# Patient Record
Sex: Female | Born: 1955 | Hispanic: No | Marital: Married | Smoking: Never smoker
Health system: Southern US, Community
[De-identification: ages and names within clinical notes are randomized; demographics above are authoritative.]

---

## 2011-04-21 ENCOUNTER — Other Ambulatory Visit: Payer: Self-pay | Admitting: Family Medicine

## 2011-04-21 DIAGNOSIS — R921 Mammographic calcification found on diagnostic imaging of breast: Secondary | ICD-10-CM

## 2011-04-28 ENCOUNTER — Ambulatory Visit
Admission: RE | Admit: 2011-04-28 | Discharge: 2011-04-28 | Disposition: A | Discharge status: Home / Self Care | Payer: BC Managed Care – PPO | Source: Ambulatory Visit | Attending: Family Medicine | Admitting: Family Medicine

## 2011-04-28 DIAGNOSIS — R921 Mammographic calcification found on diagnostic imaging of breast: Secondary | ICD-10-CM

## 2011-09-21 ENCOUNTER — Other Ambulatory Visit: Payer: Self-pay | Admitting: Family Medicine

## 2011-09-21 DIAGNOSIS — R921 Mammographic calcification found on diagnostic imaging of breast: Secondary | ICD-10-CM

## 2011-10-19 ENCOUNTER — Ambulatory Visit
Admission: RE | Admit: 2011-10-19 | Discharge: 2011-10-19 | Disposition: A | Discharge status: Home / Self Care | Payer: BC Managed Care – PPO | Source: Ambulatory Visit | Attending: Family Medicine | Admitting: Family Medicine

## 2011-10-19 DIAGNOSIS — R921 Mammographic calcification found on diagnostic imaging of breast: Secondary | ICD-10-CM

## 2012-03-18 ENCOUNTER — Other Ambulatory Visit: Payer: Self-pay | Admitting: Family Medicine

## 2012-03-18 DIAGNOSIS — R921 Mammographic calcification found on diagnostic imaging of breast: Secondary | ICD-10-CM

## 2012-04-28 ENCOUNTER — Ambulatory Visit
Admission: RE | Admit: 2012-04-28 | Discharge: 2012-04-28 | Disposition: A | Discharge status: Home / Self Care | Payer: BC Managed Care – PPO | Source: Ambulatory Visit | Attending: Family Medicine | Admitting: Family Medicine

## 2012-04-28 DIAGNOSIS — R921 Mammographic calcification found on diagnostic imaging of breast: Secondary | ICD-10-CM

## 2013-03-28 ENCOUNTER — Other Ambulatory Visit: Payer: Self-pay | Admitting: Family Medicine

## 2013-03-28 DIAGNOSIS — R921 Mammographic calcification found on diagnostic imaging of breast: Secondary | ICD-10-CM

## 2013-05-01 ENCOUNTER — Ambulatory Visit
Admission: RE | Admit: 2013-05-01 | Discharge: 2013-05-01 | Disposition: A | Discharge status: Home / Self Care | Payer: BC Managed Care – PPO | Source: Ambulatory Visit | Attending: Family Medicine | Admitting: Family Medicine

## 2013-05-01 DIAGNOSIS — R921 Mammographic calcification found on diagnostic imaging of breast: Secondary | ICD-10-CM

## 2014-04-23 ENCOUNTER — Other Ambulatory Visit: Payer: Self-pay

## 2014-04-23 DIAGNOSIS — Z1231 Encounter for screening mammogram for malignant neoplasm of breast: Secondary | ICD-10-CM

## 2014-05-08 ENCOUNTER — Ambulatory Visit
Admission: RE | Admit: 2014-05-08 | Discharge: 2014-05-08 | Disposition: A | Discharge status: Home / Self Care | Payer: BC Managed Care – PPO | Source: Ambulatory Visit

## 2014-05-08 DIAGNOSIS — Z1231 Encounter for screening mammogram for malignant neoplasm of breast: Secondary | ICD-10-CM

## 2015-05-30 ENCOUNTER — Other Ambulatory Visit: Payer: Self-pay

## 2015-05-30 DIAGNOSIS — Z1231 Encounter for screening mammogram for malignant neoplasm of breast: Secondary | ICD-10-CM

## 2015-06-11 ENCOUNTER — Ambulatory Visit: Payer: Self-pay

## 2015-06-20 ENCOUNTER — Ambulatory Visit
Admission: RE | Admit: 2015-06-20 | Discharge: 2015-06-20 | Disposition: A | Discharge status: Home / Self Care | Payer: BLUE CROSS/BLUE SHIELD | Source: Ambulatory Visit

## 2015-06-20 DIAGNOSIS — Z1231 Encounter for screening mammogram for malignant neoplasm of breast: Secondary | ICD-10-CM

## 2016-05-27 ENCOUNTER — Other Ambulatory Visit: Payer: Self-pay | Admitting: Internal Medicine

## 2016-05-27 DIAGNOSIS — Z1231 Encounter for screening mammogram for malignant neoplasm of breast: Secondary | ICD-10-CM

## 2016-06-25 ENCOUNTER — Ambulatory Visit
Admission: RE | Admit: 2016-06-25 | Discharge: 2016-06-25 | Disposition: A | Discharge status: Home / Self Care | Payer: BLUE CROSS/BLUE SHIELD | Source: Ambulatory Visit | Attending: Internal Medicine | Admitting: Internal Medicine

## 2016-06-25 DIAGNOSIS — Z1231 Encounter for screening mammogram for malignant neoplasm of breast: Secondary | ICD-10-CM

## 2017-05-27 ENCOUNTER — Other Ambulatory Visit: Payer: Self-pay | Admitting: Internal Medicine

## 2017-05-27 DIAGNOSIS — Z1231 Encounter for screening mammogram for malignant neoplasm of breast: Secondary | ICD-10-CM

## 2017-06-28 ENCOUNTER — Ambulatory Visit: Payer: BLUE CROSS/BLUE SHIELD

## 2017-07-13 ENCOUNTER — Ambulatory Visit
Admission: RE | Admit: 2017-07-13 | Discharge: 2017-07-13 | Disposition: A | Discharge status: Home / Self Care | Payer: BLUE CROSS/BLUE SHIELD | Source: Ambulatory Visit | Attending: Internal Medicine | Admitting: Internal Medicine

## 2017-07-13 DIAGNOSIS — Z1231 Encounter for screening mammogram for malignant neoplasm of breast: Secondary | ICD-10-CM

## 2018-07-21 ENCOUNTER — Other Ambulatory Visit: Payer: Self-pay | Admitting: Internal Medicine

## 2018-07-21 DIAGNOSIS — Z1231 Encounter for screening mammogram for malignant neoplasm of breast: Secondary | ICD-10-CM

## 2018-08-22 ENCOUNTER — Ambulatory Visit: Payer: BLUE CROSS/BLUE SHIELD

## 2018-10-17 ENCOUNTER — Ambulatory Visit: Payer: BLUE CROSS/BLUE SHIELD

## 2019-05-02 ENCOUNTER — Other Ambulatory Visit: Payer: Self-pay | Admitting: Obstetrics and Gynecology

## 2019-05-02 DIAGNOSIS — R928 Other abnormal and inconclusive findings on diagnostic imaging of breast: Secondary | ICD-10-CM

## 2019-05-15 ENCOUNTER — Ambulatory Visit: Payer: Medicare Other | Attending: Internal Medicine

## 2019-05-15 DIAGNOSIS — R238 Other skin changes: Secondary | ICD-10-CM

## 2019-05-15 DIAGNOSIS — U071 COVID-19: Secondary | ICD-10-CM

## 2019-05-17 ENCOUNTER — Ambulatory Visit
Admission: RE | Admit: 2019-05-17 | Discharge: 2019-05-17 | Disposition: A | Discharge status: Home / Self Care | Payer: BLUE CROSS/BLUE SHIELD | Source: Ambulatory Visit | Attending: Obstetrics and Gynecology | Admitting: Obstetrics and Gynecology

## 2019-05-17 ENCOUNTER — Other Ambulatory Visit: Payer: Self-pay

## 2019-05-17 ENCOUNTER — Ambulatory Visit: Payer: BLUE CROSS/BLUE SHIELD

## 2019-05-17 DIAGNOSIS — R928 Other abnormal and inconclusive findings on diagnostic imaging of breast: Secondary | ICD-10-CM

## 2019-05-17 LAB — NOVEL CORONAVIRUS, NAA: SARS-CoV-2, NAA: NOT DETECTED

## 2019-05-18 ENCOUNTER — Telehealth: Payer: Self-pay | Admitting: General Practice

## 2019-05-18 NOTE — Telephone Encounter (Signed)
Negative COVID results given. Patient results "NOT Detected." Caller expressed understanding. ° °

## 2019-05-26 ENCOUNTER — Ambulatory Visit: Payer: Medicare Other | Attending: Internal Medicine

## 2019-05-26 ENCOUNTER — Ambulatory Visit: Payer: Self-pay | Attending: Internal Medicine

## 2019-05-26 DIAGNOSIS — Z20822 Contact with and (suspected) exposure to covid-19: Secondary | ICD-10-CM

## 2019-05-28 LAB — NOVEL CORONAVIRUS, NAA: SARS-CoV-2, NAA: NOT DETECTED

## 2020-05-28 ENCOUNTER — Other Ambulatory Visit: Payer: Self-pay | Admitting: Obstetrics and Gynecology

## 2020-05-28 DIAGNOSIS — Z1231 Encounter for screening mammogram for malignant neoplasm of breast: Secondary | ICD-10-CM

## 2020-07-09 ENCOUNTER — Other Ambulatory Visit: Payer: Self-pay

## 2020-07-09 ENCOUNTER — Ambulatory Visit
Admission: RE | Admit: 2020-07-09 | Discharge: 2020-07-09 | Disposition: A | Discharge status: Home / Self Care | Payer: Medicare Other | Source: Ambulatory Visit | Attending: Obstetrics and Gynecology | Admitting: Obstetrics and Gynecology

## 2020-07-09 DIAGNOSIS — Z1231 Encounter for screening mammogram for malignant neoplasm of breast: Secondary | ICD-10-CM

## 2020-08-07 DIAGNOSIS — H5203 Hypermetropia, bilateral: Secondary | ICD-10-CM | POA: Diagnosis not present

## 2021-06-11 DIAGNOSIS — Z13220 Encounter for screening for lipoid disorders: Secondary | ICD-10-CM | POA: Diagnosis not present

## 2021-06-11 DIAGNOSIS — E039 Hypothyroidism, unspecified: Secondary | ICD-10-CM | POA: Diagnosis not present

## 2021-06-11 DIAGNOSIS — Z13820 Encounter for screening for osteoporosis: Secondary | ICD-10-CM | POA: Diagnosis not present

## 2021-06-11 DIAGNOSIS — Z1211 Encounter for screening for malignant neoplasm of colon: Secondary | ICD-10-CM | POA: Diagnosis not present

## 2021-06-11 DIAGNOSIS — Z136 Encounter for screening for cardiovascular disorders: Secondary | ICD-10-CM | POA: Diagnosis not present

## 2021-06-11 DIAGNOSIS — Z1231 Encounter for screening mammogram for malignant neoplasm of breast: Secondary | ICD-10-CM | POA: Diagnosis not present

## 2021-06-11 DIAGNOSIS — I10 Essential (primary) hypertension: Secondary | ICD-10-CM | POA: Diagnosis not present

## 2021-06-11 DIAGNOSIS — Z0000 Encounter for general adult medical examination without abnormal findings: Secondary | ICD-10-CM | POA: Diagnosis not present

## 2021-06-11 DIAGNOSIS — Z1159 Encounter for screening for other viral diseases: Secondary | ICD-10-CM | POA: Diagnosis not present

## 2021-07-11 ENCOUNTER — Other Ambulatory Visit: Payer: Self-pay | Admitting: Family Medicine

## 2021-07-11 DIAGNOSIS — Z1231 Encounter for screening mammogram for malignant neoplasm of breast: Secondary | ICD-10-CM

## 2021-09-01 DIAGNOSIS — I10 Essential (primary) hypertension: Secondary | ICD-10-CM | POA: Diagnosis not present

## 2021-09-01 DIAGNOSIS — E039 Hypothyroidism, unspecified: Secondary | ICD-10-CM | POA: Diagnosis not present

## 2021-09-01 DIAGNOSIS — M175 Other unilateral secondary osteoarthritis of knee: Secondary | ICD-10-CM | POA: Diagnosis not present

## 2021-09-16 DIAGNOSIS — R1013 Epigastric pain: Secondary | ICD-10-CM | POA: Diagnosis not present

## 2021-09-16 DIAGNOSIS — K219 Gastro-esophageal reflux disease without esophagitis: Secondary | ICD-10-CM | POA: Diagnosis not present

## 2021-09-22 ENCOUNTER — Ambulatory Visit
Admission: RE | Admit: 2021-09-22 | Discharge: 2021-09-22 | Disposition: A | Discharge status: Home / Self Care | Payer: Medicare Other | Source: Ambulatory Visit | Attending: Family Medicine | Admitting: Family Medicine

## 2021-09-22 DIAGNOSIS — Z1231 Encounter for screening mammogram for malignant neoplasm of breast: Secondary | ICD-10-CM | POA: Diagnosis not present

## 2021-10-06 DIAGNOSIS — R1013 Epigastric pain: Secondary | ICD-10-CM | POA: Diagnosis not present

## 2021-10-06 DIAGNOSIS — Z1211 Encounter for screening for malignant neoplasm of colon: Secondary | ICD-10-CM | POA: Diagnosis not present

## 2021-10-06 DIAGNOSIS — D120 Benign neoplasm of cecum: Secondary | ICD-10-CM | POA: Diagnosis not present

## 2021-10-06 DIAGNOSIS — K5730 Diverticulosis of large intestine without perforation or abscess without bleeding: Secondary | ICD-10-CM | POA: Diagnosis not present

## 2021-10-06 DIAGNOSIS — D125 Benign neoplasm of sigmoid colon: Secondary | ICD-10-CM | POA: Diagnosis not present

## 2021-10-06 DIAGNOSIS — K3189 Other diseases of stomach and duodenum: Secondary | ICD-10-CM | POA: Diagnosis not present

## 2021-10-06 DIAGNOSIS — K2930 Chronic superficial gastritis without bleeding: Secondary | ICD-10-CM | POA: Diagnosis not present

## 2021-10-06 DIAGNOSIS — B9681 Helicobacter pylori [H. pylori] as the cause of diseases classified elsewhere: Secondary | ICD-10-CM | POA: Diagnosis not present

## 2021-10-06 DIAGNOSIS — D128 Benign neoplasm of rectum: Secondary | ICD-10-CM | POA: Diagnosis not present

## 2021-10-10 DIAGNOSIS — D120 Benign neoplasm of cecum: Secondary | ICD-10-CM | POA: Diagnosis not present

## 2021-10-10 DIAGNOSIS — K2930 Chronic superficial gastritis without bleeding: Secondary | ICD-10-CM | POA: Diagnosis not present

## 2021-10-16 ENCOUNTER — Other Ambulatory Visit: Payer: Self-pay | Admitting: Family Medicine

## 2021-10-16 DIAGNOSIS — Z13820 Encounter for screening for osteoporosis: Secondary | ICD-10-CM

## 2022-03-11 DIAGNOSIS — E785 Hyperlipidemia, unspecified: Secondary | ICD-10-CM | POA: Diagnosis not present

## 2022-03-11 DIAGNOSIS — Z23 Encounter for immunization: Secondary | ICD-10-CM | POA: Diagnosis not present

## 2022-03-11 DIAGNOSIS — Z8249 Family history of ischemic heart disease and other diseases of the circulatory system: Secondary | ICD-10-CM | POA: Diagnosis not present

## 2022-03-11 DIAGNOSIS — M1711 Unilateral primary osteoarthritis, right knee: Secondary | ICD-10-CM | POA: Diagnosis not present

## 2022-03-16 ENCOUNTER — Other Ambulatory Visit (HOSPITAL_BASED_OUTPATIENT_CLINIC_OR_DEPARTMENT_OTHER): Payer: Self-pay | Admitting: Family Medicine

## 2022-03-16 DIAGNOSIS — Z8249 Family history of ischemic heart disease and other diseases of the circulatory system: Secondary | ICD-10-CM

## 2022-03-23 DIAGNOSIS — M25561 Pain in right knee: Secondary | ICD-10-CM | POA: Diagnosis not present

## 2022-06-01 ENCOUNTER — Ambulatory Visit (HOSPITAL_BASED_OUTPATIENT_CLINIC_OR_DEPARTMENT_OTHER)
Admission: RE | Admit: 2022-06-01 | Discharge: 2022-06-01 | Disposition: A | Discharge status: Home / Self Care | Payer: Medicare Other | Source: Ambulatory Visit | Attending: Family Medicine | Admitting: Family Medicine

## 2022-06-01 ENCOUNTER — Encounter (HOSPITAL_BASED_OUTPATIENT_CLINIC_OR_DEPARTMENT_OTHER): Payer: Self-pay

## 2022-06-01 DIAGNOSIS — Z8249 Family history of ischemic heart disease and other diseases of the circulatory system: Secondary | ICD-10-CM | POA: Insufficient documentation

## 2022-06-08 DIAGNOSIS — Z87898 Personal history of other specified conditions: Secondary | ICD-10-CM | POA: Diagnosis not present

## 2022-06-08 DIAGNOSIS — I700 Atherosclerosis of aorta: Secondary | ICD-10-CM | POA: Diagnosis not present

## 2022-06-08 DIAGNOSIS — Z1211 Encounter for screening for malignant neoplasm of colon: Secondary | ICD-10-CM | POA: Diagnosis not present

## 2022-06-08 DIAGNOSIS — Z0000 Encounter for general adult medical examination without abnormal findings: Secondary | ICD-10-CM | POA: Diagnosis not present

## 2022-06-08 DIAGNOSIS — I10 Essential (primary) hypertension: Secondary | ICD-10-CM | POA: Diagnosis not present

## 2022-06-08 DIAGNOSIS — M175 Other unilateral secondary osteoarthritis of knee: Secondary | ICD-10-CM | POA: Diagnosis not present

## 2022-06-08 DIAGNOSIS — E039 Hypothyroidism, unspecified: Secondary | ICD-10-CM | POA: Diagnosis not present

## 2022-06-08 DIAGNOSIS — I272 Pulmonary hypertension, unspecified: Secondary | ICD-10-CM | POA: Diagnosis not present

## 2022-06-08 DIAGNOSIS — Z1231 Encounter for screening mammogram for malignant neoplasm of breast: Secondary | ICD-10-CM | POA: Diagnosis not present

## 2022-06-09 ENCOUNTER — Other Ambulatory Visit: Payer: Self-pay | Admitting: Family Medicine

## 2022-06-09 DIAGNOSIS — Z13820 Encounter for screening for osteoporosis: Secondary | ICD-10-CM

## 2022-06-15 ENCOUNTER — Ambulatory Visit: Payer: Medicare Other | Admitting: Cardiology

## 2022-06-15 ENCOUNTER — Encounter: Payer: Self-pay | Admitting: Cardiology

## 2022-06-15 VITALS — BP 125/49 | HR 51 | Resp 16 | Ht 59.0 in | Wt 241.0 lb

## 2022-06-15 DIAGNOSIS — I7121 Aneurysm of the ascending aorta, without rupture: Secondary | ICD-10-CM | POA: Insufficient documentation

## 2022-06-15 DIAGNOSIS — I77810 Thoracic aortic ectasia: Secondary | ICD-10-CM

## 2022-06-15 DIAGNOSIS — R931 Abnormal findings on diagnostic imaging of heart and coronary circulation: Secondary | ICD-10-CM | POA: Diagnosis not present

## 2022-06-15 DIAGNOSIS — E782 Mixed hyperlipidemia: Secondary | ICD-10-CM

## 2022-06-15 DIAGNOSIS — I700 Atherosclerosis of aorta: Secondary | ICD-10-CM | POA: Diagnosis not present

## 2022-06-15 DIAGNOSIS — I2729 Other secondary pulmonary hypertension: Secondary | ICD-10-CM | POA: Insufficient documentation

## 2022-06-15 DIAGNOSIS — I10 Essential (primary) hypertension: Secondary | ICD-10-CM | POA: Diagnosis not present

## 2022-06-15 MED ORDER — ROSUVASTATIN CALCIUM 20 MG PO TABS: 20.0000 mg | ORAL_TABLET | Freq: Every day | ORAL | 3 refills | Status: DC

## 2022-06-15 NOTE — Progress Notes (Signed)
Patient referred by [name], MD for pulmonary hypertension  Subjective:   Kendra Green, female    DOB: January 10, 1956, 67 y.o.   MRN: [id number]   Chief Complaint  Patient presents with   Hypertension   New Patient (Initial Visit)    HPI  67 y.o. [place] female with prediabetes, obesity, aortic atherosclerosis, elevated coronary calcium score, dilated ascending aorta, pulmonary hypertension.  Patient speaks [place].  Language interpretation assisted by patient's husband.  Patient recently underwent coronary calcium score testing through PCP Dr. Olen Pel.  This showed elevated coronary calcium score, as well as dilated ascending aorta and dilated pulmonary artery, with history of hypertension.  Patient is very sedentary lifestyle, but denies any chest pain, shortness of breath, leg edema, orthopnea, PND symptoms.  Blood pressure is well-controlled.  Patient has no known family history of aortopathy's.  She does have history of CAD in 57s and 55s in both her parents.   History reviewed. No pertinent past medical history.   Past Surgical History:  Procedure Laterality Date   CESAREAN SECTION       Social History   Tobacco Use  Smoking Status Not on file  Smokeless Tobacco Not on file    Social History   Substance and Sexual Activity  Alcohol Use None     Family History  Problem Relation Age of Onset   Heart disease Mother    Heart disease Father       Current Outpatient Medications:    atenolol (TENORMIN) 50 MG tablet, Take 50 mg by mouth daily., Disp: , Rfl:    irbesartan-hydrochlorothiazide (AVALIDE) 150-12.5 MG tablet, Take 1 tablet by mouth daily., Disp: , Rfl:    levothyroxine (SYNTHROID) 100 MCG tablet, Take 100 mcg by mouth daily before breakfast., Disp: , Rfl:    omeprazole (PRILOSEC) 20 MG capsule, Take 20 mg by mouth daily., Disp: , Rfl:    simvastatin (ZOCOR) 20 MG tablet, Take 20 mg by mouth daily at 6 PM., Disp: , Rfl:     Cardiovascular and other pertinent studies:  Reviewed external labs and tests, independently interpreted  EKG 06/15/2022: Sinus rhythm 52 bpm Possible old anteroseptal infarct Low voltage in precordial leads   CT cardiac scoring 05/22/2022: LM: 0 LAD: 96.6 LCx: 8 RCA: 0   Total: 105 Percentile: 76th  Aorta: Dilated to 43 mm (non-contrast) at the level of the main PA bifurcation. Aortic atherosclerosis. The main PA is dilated to >30 mm, suggestive of pulmonary hypertension.   Recent labs: 06/08/2022: Glucose 107, BUN/Cr 20/0.73. EGFR 89. Na/K 138/4.3. Rest of the CMP normal Chol 170, TG 198, HDL 35, LDL 101 TSH 1.6 normal    Review of Systems  Cardiovascular:  Negative for chest pain, dyspnea on exertion, leg swelling, palpitations and syncope.         Vitals:   06/15/22 1119  BP: (!) 125/49  Pulse: (!) 51  Resp: 16  SpO2: 96%     Body mass index is 48.68 kg/m. Filed Weights   06/15/22 1119  Weight: 241 lb (109.3 kg)     Objective:   Physical Exam Vitals and nursing note reviewed.  Constitutional:      General: She is not in acute distress.    Appearance: She is obese.  Neck:     Vascular: No JVD.  Cardiovascular:     Rate and Rhythm: Normal rate and regular rhythm.     Heart sounds: Normal heart sounds. No murmur heard. Pulmonary:  Effort: Pulmonary effort is normal.     Breath sounds: Normal breath sounds. No wheezing or rales.  Musculoskeletal:     Right lower leg: No edema.     Left lower leg: No edema.           Visit diagnoses:   ICD-10-CM   1. Other secondary pulmonary hypertension (HCC)  I27.29 EKG 12-Lead    2. Ascending aorta dilatation (HCC)  I77.810 CT ANGIO CHEST AORTA W/CM & OR WO/CM    PCV ECHOCARDIOGRAM COMPLETE    3. Agatston coronary artery calcium score between 100 and 400  R93.1     4. Aortic atherosclerosis (HCC)  I70.0     5. Essential hypertension  I10 PCV ECHOCARDIOGRAM COMPLETE    6. Mixed  hyperlipidemia  E78.2 Lipid panel    Lipid panel       Orders Placed This Encounter  Procedures   CT ANGIO CHEST AORTA W/CM & OR WO/CM   Lipid panel   EKG 12-Lead   PCV ECHOCARDIOGRAM COMPLETE     Medication changes this visit: Medications Discontinued During This Encounter  Medication Reason   simvastatin (ZOCOR) 20 MG tablet Change in therapy    Meds ordered this encounter  Medications   rosuvastatin (CRESTOR) 20 MG tablet    Sig: Take 1 tablet (20 mg total) by mouth daily.    Dispense:  90 tablet    Refill:  3     Assessment & Recommendations:   67 y.o. Caucasian female with prediabetes, obesity, aortic atherosclerosis, elevated coronary calcium score, dilated ascending aorta, pulmonary hypertension  Ascending aorta dilation: Noted on noncontrast CT chest at 4.3 cm.  Will obtain echocardiogram and CT angiogram with contrast for more definitive measurements. She is already on atenolol, and irbesartan-hydrochlorothiazide with very well-controlled heart rate and blood pressure.  Continue the same. No restriction on aerobic activity.  Avoid lifting weight >20 pounds. I anticipate we may need annual CT scan for surveillance.  Pulmonary hypertension: Indirect diagnosis based on pulmonary artery size >3 cm noted on noncontrast CT scan.  Will check echocardiogram for better evaluation.  Elevated coronary calcium score: Total score 107.  LDL 101.  Change simvastatin to Crestor 20 mg daily. Discussed diet and lifestyle modification.  Hypertension: Controlled  Recommendations after above testing.   Thank you for referring the patient to [place]. Please feel free to contact with any questions.   Nigel Mormon, MD Pager: [phone] Office: [phone]

## 2022-06-24 ENCOUNTER — Ambulatory Visit: Payer: Medicare Other

## 2022-06-24 DIAGNOSIS — I10 Essential (primary) hypertension: Secondary | ICD-10-CM | POA: Diagnosis not present

## 2022-06-24 DIAGNOSIS — I77810 Thoracic aortic ectasia: Secondary | ICD-10-CM

## 2022-07-02 ENCOUNTER — Telehealth: Payer: Self-pay

## 2022-07-02 ENCOUNTER — Ambulatory Visit
Admission: RE | Admit: 2022-07-02 | Discharge: 2022-07-02 | Disposition: A | Discharge status: Home / Self Care | Payer: Medicare Other | Source: Ambulatory Visit | Attending: Cardiology | Admitting: Cardiology

## 2022-07-02 DIAGNOSIS — I77810 Thoracic aortic ectasia: Secondary | ICD-10-CM

## 2022-07-02 DIAGNOSIS — I779 Disorder of arteries and arterioles, unspecified: Secondary | ICD-10-CM | POA: Diagnosis not present

## 2022-07-02 DIAGNOSIS — I712 Thoracic aortic aneurysm, without rupture, unspecified: Secondary | ICD-10-CM | POA: Diagnosis not present

## 2022-07-02 DIAGNOSIS — J8410 Pulmonary fibrosis, unspecified: Secondary | ICD-10-CM | POA: Diagnosis not present

## 2022-07-02 DIAGNOSIS — I359 Nonrheumatic aortic valve disorder, unspecified: Secondary | ICD-10-CM | POA: Diagnosis not present

## 2022-07-02 DIAGNOSIS — J984 Other disorders of lung: Secondary | ICD-10-CM | POA: Diagnosis not present

## 2022-07-02 MED ORDER — IOPAMIDOL (ISOVUE-370) INJECTION 76%
75.0000 mL | Freq: Once | INTRAVENOUS | Status: AC | PRN
  Administered 2022-07-02: 75 mL via INTRAVENOUS

## 2022-07-02 NOTE — Telephone Encounter (Signed)
They don't use MyChart  FYI

## 2022-07-02 NOTE — Telephone Encounter (Signed)
I had sent a My Chart message, as below.  Aorta is dilated, which was known to [place]. Otherwise, Normal pumping function of the heart. No severe heart valve abnormalities noted.  Thanks MJP

## 2022-07-02 NOTE — Telephone Encounter (Signed)
They may have signed up but do not know the password. Chart shows the green check mark when MyChart is signed up for. Nevertheless, thank you for conveying the results.  MJP

## 2022-07-02 NOTE — Telephone Encounter (Signed)
Husband calling for echo results

## 2022-07-15 ENCOUNTER — Telehealth: Payer: Self-pay

## 2022-07-15 NOTE — Telephone Encounter (Signed)
Patients calling wanting to know the results for the CT Calcium score. I see them In there but it has not been resulted yet.   Call back number is [phone]

## 2022-07-16 NOTE — Telephone Encounter (Signed)
CT scan shows dilated aorta, something that was previously noted as well.  There is no change in the aorta size.  Continue current medications.  Thanks MJP

## 2022-08-06 ENCOUNTER — Other Ambulatory Visit: Payer: Self-pay | Admitting: Family Medicine

## 2022-08-06 DIAGNOSIS — Z1231 Encounter for screening mammogram for malignant neoplasm of breast: Secondary | ICD-10-CM

## 2022-09-08 DIAGNOSIS — E782 Mixed hyperlipidemia: Secondary | ICD-10-CM | POA: Diagnosis not present

## 2022-09-08 DIAGNOSIS — I10 Essential (primary) hypertension: Secondary | ICD-10-CM | POA: Diagnosis not present

## 2022-09-29 ENCOUNTER — Ambulatory Visit
Admission: RE | Admit: 2022-09-29 | Discharge: 2022-09-29 | Disposition: A | Discharge status: Home / Self Care | Payer: Medicare Other | Source: Ambulatory Visit | Attending: Family Medicine | Admitting: Family Medicine

## 2022-09-29 DIAGNOSIS — Z1231 Encounter for screening mammogram for malignant neoplasm of breast: Secondary | ICD-10-CM | POA: Diagnosis not present

## 2022-10-29 ENCOUNTER — Ambulatory Visit: Payer: Medicare Other | Admitting: Cardiology

## 2022-10-29 ENCOUNTER — Encounter: Payer: Self-pay | Admitting: Cardiology

## 2022-10-29 VITALS — BP 128/74 | HR 60 | Resp 12 | Ht 59.0 in | Wt 246.2 lb

## 2022-10-29 DIAGNOSIS — I77810 Thoracic aortic ectasia: Secondary | ICD-10-CM

## 2022-10-29 DIAGNOSIS — E782 Mixed hyperlipidemia: Secondary | ICD-10-CM | POA: Diagnosis not present

## 2022-10-29 DIAGNOSIS — I10 Essential (primary) hypertension: Secondary | ICD-10-CM | POA: Diagnosis not present

## 2022-10-29 MED ORDER — ROSUVASTATIN CALCIUM 20 MG PO TABS: 20.0000 mg | ORAL_TABLET | Freq: Every day | ORAL | 3 refills | Status: AC

## 2022-10-29 NOTE — Progress Notes (Signed)
Patient referred by Kendra Shiver, DO for pulmonary hypertension  Subjective:   Kendra Green, female    DOB: [birth year], 67 y.o.   MRN: [id number]   Chief Complaint  Patient presents with   TAA    HPI  67 y.o. Kendra Green female with prediabetes, obesity, aortic atherosclerosis, elevated coronary calcium score, dilated ascending aorta, pulmonary hypertension.  Patient is doing well, denies chest pain, shortness of breath, palpitations, leg edema, orthopnea, PND, TIA/syncope. Reviewed recent test results with the patient, details below.     Initial consultation visit 05/2022:  Patient speaks [place].  Language interpretation assisted by patient's husband.  Patient recently underwent coronary calcium score testing through PCP Dr. Lenise Arena.  This showed elevated coronary calcium score, as well as dilated ascending aorta and dilated pulmonary artery, with history of hypertension.  Patient is very sedentary lifestyle, but denies any chest pain, shortness of breath, leg edema, orthopnea, PND symptoms.  Blood pressure is well-controlled.  Patient has no known family history of aortopathy's.  She does have history of CAD in 57s and 76s in both her parents.   Current Outpatient Medications:    atenolol (TENORMIN) 50 MG tablet, Take 50 mg by mouth daily., Disp: , Rfl:    irbesartan-hydrochlorothiazide (AVALIDE) 150-12.5 MG tablet, Take 1 tablet by mouth daily., Disp: , Rfl:    levothyroxine (SYNTHROID) 100 MCG tablet, Take 100 mcg by mouth daily before breakfast., Disp: , Rfl:    omeprazole (PRILOSEC) 20 MG capsule, Take 20 mg by mouth daily., Disp: , Rfl:    rosuvastatin (CRESTOR) 20 MG tablet, Take 1 tablet (20 mg total) by mouth daily., Disp: 90 tablet, Rfl: 3   Cardiovascular and other pertinent studies:  Reviewed external labs and tests, independently interpreted  EKG 10/29/2022: Sinus bradycardia 49 bpm  Low voltage in precordial leads   Echocardiogram  06/24/2022: Normal LV systolic function with visual EF 60-65%. Left ventricle cavity is normal in size. Mild concentric hypertrophy of the left ventricle. Normal global wall motion. Normal diastolic filling pattern, normal LAP. Calculated EF 72%. Structurally normal tricuspid valve with trace regurgitation. Mild pulmonary hypertension. RVSP measures 33 mmHg. The aortic root is normal. Mildly dilated ascending aorta at 3.8 cm. no prior available for comparison.  CTA aorta 07/02/2022: 1. Mild aneurysmal dilatation of the ascending thoracic aorta, measuring 4.0 cm in diameter. Recommend annual imaging followup by CTA or MRA. This recommendation follows 2010 ACCF/AHA/AATS/ACR/ASA/SCA/SCAI/SIR/STS/SVM Guidelines for the Diagnosis and Management of Patients with Thoracic Aortic Disease. Circulation. 2010; 121: W098-J191. Aortic aneurysm NOS (ICD10-I71.9) 2. Calcified granulomata in both lungs. No noncalcified nodules are visible today. 3. Mild cardiomegaly.    CT cardiac scoring 05/22/2022: LM: 0 LAD: 96.6 LCx: 8 RCA: 0   Total: 105 Percentile: 76th  Aorta: Dilated to 43 mm (non-contrast) at the level of the main PA bifurcation. Aortic atherosclerosis. The main PA is dilated to >30 mm, suggestive of pulmonary hypertension.   Recent labs: 06/08/2022: Glucose 107, BUN/Cr 20/0.73. EGFR 89. Na/K 138/4.3. Rest of the CMP normal Chol 170, TG 198, HDL 35, LDL 101 TSH 1.6 normal    Review of Systems  Cardiovascular:  Negative for chest pain, dyspnea on exertion, leg swelling, palpitations and syncope.         Vitals:   10/29/22 1430  BP: 128/74  Pulse: 60  Resp: 12  SpO2: 97%     Body mass index is 49.73 kg/m. Filed Weights   10/29/22 1430  Weight: 246 lb 3.2 oz (  111.7 kg)     Objective:   Physical Exam Vitals and nursing note reviewed.  Constitutional:      General: She is not in acute distress.    Appearance: She is obese.  Neck:     Vascular: No JVD.   Cardiovascular:     Rate and Rhythm: Normal rate and regular rhythm.     Heart sounds: Normal heart sounds. No murmur heard. Pulmonary:     Effort: Pulmonary effort is normal.     Breath sounds: Normal breath sounds. No wheezing or rales.  Musculoskeletal:     Right lower leg: No edema.     Left lower leg: No edema.           Visit diagnoses: No diagnosis found.    No orders of the defined types were placed in this encounter.    Medication changes this visit: There are no discontinued medications.   No orders of the defined types were placed in this encounter.    Assessment & Recommendations:   67 y.o. Caucasian female with prediabetes, obesity, aortic atherosclerosis, elevated coronary calcium score, dilated ascending aorta, pulmonary hypertension  Ascending aorta dilation: 4.0 cm on CTA aorta (06/2022), 3.8 cm on echocardiogram (06/2022) She is already on atenolol, and irbesartan-hydrochlorothiazide with very well-controlled heart rate and blood pressure.  Continue the same. No restriction on aerobic activity.  Avoid lifting weight >20 pounds. Will obtain surveillance with every other year echocardiogram and CTA aorta.   Pulmonary hypertension: Indirect diagnosis based on pulmonary artery size >3 cm noted on noncontrast CT scan. Estimated PASP only modestly elevated at 35 mmHg. This could well be jist due to obesity. No further workup needed at this time.   Elevated coronary calcium score: Total score 107.  LDL 101.   Continue Crestor 20 mg daily. Check lipid panel.  Hypertension: Controlled  F/u in 6 months    Elder Negus, MD Pager: [phone] Office: [phone]

## 2022-11-09 ENCOUNTER — Ambulatory Visit: Payer: Medicare Other | Admitting: Cardiology

## 2023-02-11 ENCOUNTER — Other Ambulatory Visit: Payer: Medicare Other

## 2023-03-12 DIAGNOSIS — I10 Essential (primary) hypertension: Secondary | ICD-10-CM | POA: Diagnosis not present

## 2023-03-12 DIAGNOSIS — E782 Mixed hyperlipidemia: Secondary | ICD-10-CM | POA: Diagnosis not present

## 2023-03-12 DIAGNOSIS — I700 Atherosclerosis of aorta: Secondary | ICD-10-CM | POA: Diagnosis not present

## 2023-03-12 DIAGNOSIS — Z23 Encounter for immunization: Secondary | ICD-10-CM | POA: Diagnosis not present

## 2023-03-12 DIAGNOSIS — R1319 Other dysphagia: Secondary | ICD-10-CM | POA: Diagnosis not present

## 2023-03-12 DIAGNOSIS — R7303 Prediabetes: Secondary | ICD-10-CM | POA: Diagnosis not present

## 2023-03-12 DIAGNOSIS — I272 Pulmonary hypertension, unspecified: Secondary | ICD-10-CM | POA: Diagnosis not present

## 2023-03-15 ENCOUNTER — Other Ambulatory Visit (HOSPITAL_COMMUNITY): Payer: Self-pay | Admitting: Family Medicine

## 2023-03-15 DIAGNOSIS — R1319 Other dysphagia: Secondary | ICD-10-CM

## 2023-04-05 DIAGNOSIS — J208 Acute bronchitis due to other specified organisms: Secondary | ICD-10-CM | POA: Diagnosis not present

## 2023-04-05 DIAGNOSIS — R051 Acute cough: Secondary | ICD-10-CM | POA: Diagnosis not present

## 2023-04-12 ENCOUNTER — Ambulatory Visit (HOSPITAL_COMMUNITY)
Admission: RE | Admit: 2023-04-12 | Discharge: 2023-04-12 | Disposition: A | Discharge status: Home / Self Care | Payer: Medicare Other | Source: Ambulatory Visit | Attending: Family Medicine | Admitting: Family Medicine

## 2023-04-12 DIAGNOSIS — K219 Gastro-esophageal reflux disease without esophagitis: Secondary | ICD-10-CM | POA: Diagnosis not present

## 2023-04-12 DIAGNOSIS — R1319 Other dysphagia: Secondary | ICD-10-CM | POA: Insufficient documentation

## 2023-04-29 ENCOUNTER — Encounter: Payer: Self-pay | Admitting: Cardiology

## 2023-04-29 ENCOUNTER — Ambulatory Visit: Payer: Medicare Other | Attending: Cardiology | Admitting: Cardiology

## 2023-04-29 VITALS — BP 138/78 | HR 49 | Ht 59.0 in | Wt 239.2 lb

## 2023-04-29 DIAGNOSIS — E782 Mixed hyperlipidemia: Secondary | ICD-10-CM | POA: Diagnosis not present

## 2023-04-29 DIAGNOSIS — I7121 Aneurysm of the ascending aorta, without rupture: Secondary | ICD-10-CM

## 2023-04-29 DIAGNOSIS — R0609 Other forms of dyspnea: Secondary | ICD-10-CM | POA: Diagnosis not present

## 2023-04-29 DIAGNOSIS — I712 Thoracic aortic aneurysm, without rupture, unspecified: Secondary | ICD-10-CM

## 2023-04-29 NOTE — Progress Notes (Signed)
Cardiology Office Note:  .   Date:  04/29/2023  ID:  Kendra Green, DOB 1955/08/18, MRN [id number] PCP: Koren Shiver, DO  [facility] Providers Cardiologist:  Truett Mainland, MD PCP: Koren Shiver, DO  Chief Complaint  Patient presents with   TAA      History of Present Illness: Kendra Green    Kendra Green is a 67 y.o. female with prediabetes, obesity, aortic atherosclerosis, elevated coronary calcium score, dilated ascending aorta, pulmonary hypertension.   Patient is here today with her daughter, who assisted with language interpretation, in absence of pre and language interpreter available onsite today.  Patient is doing fairly well with no change in symptomatology since last visit.  Blood pressure elevated on first check, improved on second check.  She still has exertional dyspnea without chest pain.  Vitals:   04/29/23 1127 04/29/23 1136  BP: (!) 152/80 138/78  Pulse: (!) 49   SpO2: 94%      ROS:  Review of Systems  Cardiovascular:  Positive for dyspnea on exertion. Negative for chest pain, leg swelling, palpitations and syncope.     Studies Reviewed: Kendra Green       No new studies reviewed today.    Physical Exam:   Physical Exam Vitals and nursing note reviewed.  Constitutional:      General: She is not in acute distress.    Appearance: She is obese.  Neck:     Vascular: No JVD.  Cardiovascular:     Rate and Rhythm: Normal rate and regular rhythm.     Heart sounds: Normal heart sounds. No murmur heard. Pulmonary:     Effort: Pulmonary effort is normal.     Breath sounds: Normal breath sounds. No wheezing or rales.  Musculoskeletal:     Right lower leg: No edema.     Left lower leg: No edema.      VISIT DIAGNOSES:   ICD-10-CM   1. Exertional dyspnea  R06.09 NM PET CT CARDIAC PERFUSION MULTI W/ABSOLUTE BLOODFLOW    Cardiac Stress Test: Informed Consent Details: Physician/Practitioner Attestation; Transcribe to consent form and  obtain patient signature    2. Mixed hyperlipidemia  E78.2 Lipid Profile    Cardiac Stress Test: Informed Consent Details: Physician/Practitioner Attestation; Transcribe to consent form and obtain patient signature    Lipid Profile    3. Aneurysm of ascending aorta without rupture (HCC)  I71.21 ECHOCARDIOGRAM COMPLETE       ASSESSMENT AND PLAN: .    Kendra Green is a 67 y.o. female with prediabetes, obesity, aortic atherosclerosis, elevated coronary calcium score, dilated ascending aorta, pulmonary hypertension   Exertional dyspnea: While this could be related to deconditioning, obesity, need to exclude CAD. Will obtain PET/CT stress test, on which we will also get an idea about aorta measurement.  Ascending aorta dilation: 4.0 cm on CTA aorta (06/2022), 3.8 cm on echocardiogram (06/2022) She is already on atenolol, and irbesartan-hydrochlorothiazide with fairly well-controlled heart rate and blood pressure.  Continue the same. No restriction on aerobic activity.  Avoid lifting weight >20 pounds. Will repeat echocardiogram in 1 year.   Pulmonary hypertension: Indirect diagnosis based on pulmonary artery size >3 cm noted on noncontrast CT scan. Estimated PASP only modestly elevated at 35 mmHg. This could well be jist due to obesity. No further workup needed at this time.    Elevated coronary calcium score: Total score 107.  LDL 101.   Continue Crestor 20 mg daily. Check lipid panel.   Hypertension: Controlled  Informed Consent   Shared Decision Making/Informed Consent The risks [chest pain, shortness of breath, cardiac arrhythmias, dizziness, blood pressure fluctuations, myocardial infarction, stroke/transient ischemic attack, nausea, vomiting, allergic reaction, radiation exposure, metallic taste sensation and life-threatening complications (estimated to be 1 in 10,000)], benefits (risk stratification, diagnosing coronary artery disease, treatment guidance) and  alternatives of a cardiac PET stress test were discussed in detail with Ms. Summit and she agrees to proceed.       F/u in 1 year  Signed, Elder Negus, MD

## 2023-04-29 NOTE — Patient Instructions (Signed)
Medication Instructions:   Your physician recommends that you continue on your current medications as directed. Please refer to the Current Medication list given to you today.  *If you need a refill on your cardiac medications before your next appointment, please call your pharmacy*   Lab Work:  TODAY --LIPIDS--GO DOWNSTAIRS TO THE FIRST FLOOR OF OUR BUILDING TO [facility]  If you have labs (blood work) drawn today and your tests are completely normal, you will receive your results only by: MyChart Message (if you have MyChart) OR A paper copy in the mail If you have any lab test that is abnormal or we need to change your treatment, we will call you to review the results.   Testing/Procedures:  Your physician has requested that you have an echocardiogram. Echocardiography is a painless test that uses sound waves to create images of your heart. It provides your doctor with information about the size and shape of your heart and how well your heart's chambers and valves are working. This procedure takes approximately one hour. There are no restrictions for this procedure.  SCHEDULE ECHO TO BE DONE IN ONE YEAR (DECEMBER 2025)  Please do NOT wear cologne, perfume, aftershave, or lotions (deodorant is allowed). Please arrive 15 minutes prior to your appointment time.  Please note: We ask at that you not bring children with you during ultrasound (echo/ vascular) testing. Due to room size and safety concerns, children are not allowed in the ultrasound rooms during exams. Our front office staff cannot provide observation of children in our lobby area while testing is being conducted. An adult accompanying a patient to their appointment will only be allowed in the ultrasound room at the discretion of the ultrasound technician under special circumstances. We apologize for any inconvenience.        Please report to Radiology at the [facility] Main Entrance 30 minutes early for your  test.  [address] [place], [place] [address]                  How to Prepare for Your Cardiac PET/CT Stress Test:  Nothing to eat or drink, except water, 3 hours prior to arrival time.  NO caffeine/decaffeinated products, or chocolate 12 hours prior to arrival. (Please note decaffeinated beverages (teas/coffees) still contain caffeine).  If you have caffeine within 12 hours prior, the test will need to be rescheduled.  Medication instructions:   You may take your remaining medications with water.  NO perfume, cologne or lotion on chest or abdomen area. FEMALES - Please avoid wearing dresses to this appointment.  Total time is 1 to 2 hours; you may want to bring reading material for the waiting time.  IF YOU THINK YOU MAY BE PREGNANT, OR ARE NURSING PLEASE INFORM THE TECHNOLOGIST.  In preparation for your appointment, medication and supplies will be purchased.  Appointment availability is limited, so if you need to cancel or reschedule, please call the Radiology Department at [phone] [facility]) OR [phone] [facility]) 24 hours in advance to avoid a cancellation fee of $100.00  What to Expect When you Arrive:  Once you arrive and check in for your appointment, you will be taken to a preparation room within the Radiology Department.  A technologist or Nurse will obtain your medical history, verify that you are correctly prepped for the exam, and explain the procedure.  Afterwards, an IV will be started in your arm and electrodes will be placed on your skin for EKG monitoring  during the stress portion of the exam. Then you will be escorted to the PET/CT scanner.  There, staff will get you positioned on the scanner and obtain a blood pressure and EKG.  During the exam, you will continue to be connected to the EKG and blood pressure machines.  A small, safe amount of a radioactive tracer will be injected in your IV to obtain a series of pictures of your heart along with an injection  of a stress agent.    After your Exam:  It is recommended that you eat a meal and drink a caffeinated beverage to counter act any effects of the stress agent.  Drink plenty of fluids for the remainder of the day and urinate frequently for the first couple of hours after the exam.  Your doctor will inform you of your test results within 7-10 business days.  For more information and frequently asked questions, please visit our website: [email or web address]  For questions about your test or how to prepare for your test, please call: Cardiac Imaging Nurse Navigators Office: [phone]     Follow-Up:  IN JANUARY 2026 WITH DR. PATWARDHAN

## 2023-04-30 ENCOUNTER — Ambulatory Visit: Payer: Self-pay | Admitting: Cardiology

## 2023-04-30 ENCOUNTER — Encounter: Payer: Self-pay | Admitting: Cardiology

## 2023-04-30 DIAGNOSIS — R0609 Other forms of dyspnea: Secondary | ICD-10-CM | POA: Insufficient documentation

## 2023-04-30 LAB — LIPID PANEL
Chol/HDL Ratio: 3.2 {ratio} (ref 0.0–4.4)
Cholesterol, Total: 102 mg/dL (ref 100–199)
HDL: 32 mg/dL — ABNORMAL LOW (ref >39)
LDL Chol Calc (NIH): 46 mg/dL (ref 0–99)
Triglycerides: 139 mg/dL (ref 0–149)
VLDL Cholesterol Cal: 24 mg/dL (ref 5–40)

## 2023-06-11 DIAGNOSIS — Z7185 Encounter for immunization safety counseling: Secondary | ICD-10-CM | POA: Diagnosis not present

## 2023-06-11 DIAGNOSIS — M179 Osteoarthritis of knee, unspecified: Secondary | ICD-10-CM | POA: Diagnosis not present

## 2023-06-11 DIAGNOSIS — Z0000 Encounter for general adult medical examination without abnormal findings: Secondary | ICD-10-CM | POA: Diagnosis not present

## 2023-06-11 DIAGNOSIS — Z780 Asymptomatic menopausal state: Secondary | ICD-10-CM | POA: Diagnosis not present

## 2023-06-11 DIAGNOSIS — Z1231 Encounter for screening mammogram for malignant neoplasm of breast: Secondary | ICD-10-CM | POA: Diagnosis not present

## 2023-06-11 DIAGNOSIS — E1169 Type 2 diabetes mellitus with other specified complication: Secondary | ICD-10-CM | POA: Diagnosis not present

## 2023-06-11 DIAGNOSIS — Z1211 Encounter for screening for malignant neoplasm of colon: Secondary | ICD-10-CM | POA: Diagnosis not present

## 2023-06-11 DIAGNOSIS — E039 Hypothyroidism, unspecified: Secondary | ICD-10-CM | POA: Diagnosis not present

## 2023-06-11 DIAGNOSIS — I10 Essential (primary) hypertension: Secondary | ICD-10-CM | POA: Diagnosis not present

## 2023-06-11 DIAGNOSIS — E782 Mixed hyperlipidemia: Secondary | ICD-10-CM | POA: Diagnosis not present

## 2023-06-18 DIAGNOSIS — M1711 Unilateral primary osteoarthritis, right knee: Secondary | ICD-10-CM | POA: Diagnosis not present

## 2023-06-18 DIAGNOSIS — M1712 Unilateral primary osteoarthritis, left knee: Secondary | ICD-10-CM | POA: Diagnosis not present

## 2023-07-09 DIAGNOSIS — M1711 Unilateral primary osteoarthritis, right knee: Secondary | ICD-10-CM | POA: Diagnosis not present

## 2023-07-14 DIAGNOSIS — M1711 Unilateral primary osteoarthritis, right knee: Secondary | ICD-10-CM | POA: Diagnosis not present

## 2023-07-22 DIAGNOSIS — M1711 Unilateral primary osteoarthritis, right knee: Secondary | ICD-10-CM | POA: Diagnosis not present

## 2023-08-02 ENCOUNTER — Telehealth (HOSPITAL_COMMUNITY): Payer: Self-pay | Admitting: *Deleted

## 2023-08-02 NOTE — Telephone Encounter (Signed)
 Attempted to call patient regarding upcoming cardiac PET appointment (via interpreter ID # [phone]). Left message on voicemail with name and callback number [name] RN Navigator Cardiac Imaging [facility] [phone] Office

## 2023-08-03 ENCOUNTER — Encounter (HOSPITAL_COMMUNITY): Payer: Medicare Other

## 2023-08-13 ENCOUNTER — Encounter (HOSPITAL_COMMUNITY): Payer: Self-pay

## 2023-08-17 ENCOUNTER — Telehealth (HOSPITAL_COMMUNITY): Payer: Self-pay | Admitting: Emergency Medicine

## 2023-08-17 NOTE — Telephone Encounter (Signed)
 Reaching out to patient to offer assistance regarding upcoming cardiac imaging study; pt verbalizes understanding of appt date/time, parking situation and where to check in, pre-test NPO status and medications ordered, and verified current allergies; name and call back number provided for further questions should they arise Rockwell Alexandria RN Navigator Cardiac Imaging [facility] [phone] office [phone] cell

## 2023-08-18 ENCOUNTER — Encounter (HOSPITAL_COMMUNITY)
Admission: RE | Admit: 2023-08-18 | Discharge: 2023-08-18 | Disposition: A | Discharge status: Home / Self Care | Source: Ambulatory Visit | Attending: Cardiology | Admitting: Cardiology

## 2023-08-18 DIAGNOSIS — R0609 Other forms of dyspnea: Secondary | ICD-10-CM | POA: Diagnosis not present

## 2023-08-18 LAB — NM PET CT CARDIAC PERFUSION MULTI W/ABSOLUTE BLOODFLOW
LV dias vol: 101 mL (ref 46–106)
LV sys vol: 27 mL
MBFR: 1.88
Nuc Rest EF: 73 %
Nuc Stress EF: 76 %
Rest MBF: 1.03 ml/g/min
Rest Nuclear Isotope Dose: 28.3 mCi
ST Depression (mm): 0 mm
Stress MBF: 1.94 ml/g/min
Stress Nuclear Isotope Dose: 28.4 mCi

## 2023-08-18 MED ORDER — RUBIDIUM RB82 GENERATOR (RUBYFILL)
28.3000 | PACK | Freq: Once | INTRAVENOUS | Status: AC
  Administered 2023-08-18: 28.36 via INTRAVENOUS

## 2023-08-18 MED ORDER — REGADENOSON 0.4 MG/5ML IV SOLN
0.4000 mg | Freq: Once | INTRAVENOUS | Status: AC
  Administered 2023-08-18: 0.4 mg via INTRAVENOUS

## 2023-08-18 MED ORDER — RUBIDIUM RB82 GENERATOR (RUBYFILL)
28.3000 | PACK | Freq: Once | INTRAVENOUS | Status: AC
  Administered 2023-08-18: 28.32 via INTRAVENOUS

## 2023-08-18 MED ORDER — REGADENOSON 0.4 MG/5ML IV SOLN
INTRAVENOUS | Status: AC
  Filled 2023-08-18: qty 5

## 2023-08-18 NOTE — Progress Notes (Signed)
 No significant heart muscle circulation abnormalities noted on stress test. Continue current medications.  Thanks MJP

## 2023-10-06 IMAGING — MG MM DIGITAL SCREENING BILAT W/ TOMO AND CAD
6 of 12 series · 6 of 36 positions shown · non-contrast
Comparison: Previous exam(s).

ACR Breast Density Category a: The breast tissue is almost entirely
fatty.

CLINICAL DATA: Screening.

EXAM:
DIGITAL SCREENING BILATERAL MAMMOGRAM WITH TOMOSYNTHESIS AND CAD
TECHNIQUE: Bilateral screening digital craniocaudal and mediolateral oblique
mammograms were obtained. Bilateral screening digital breast
tomosynthesis was performed. The images were evaluated with
computer-aided detection.

[L CC synth-2D (1 of 2)]
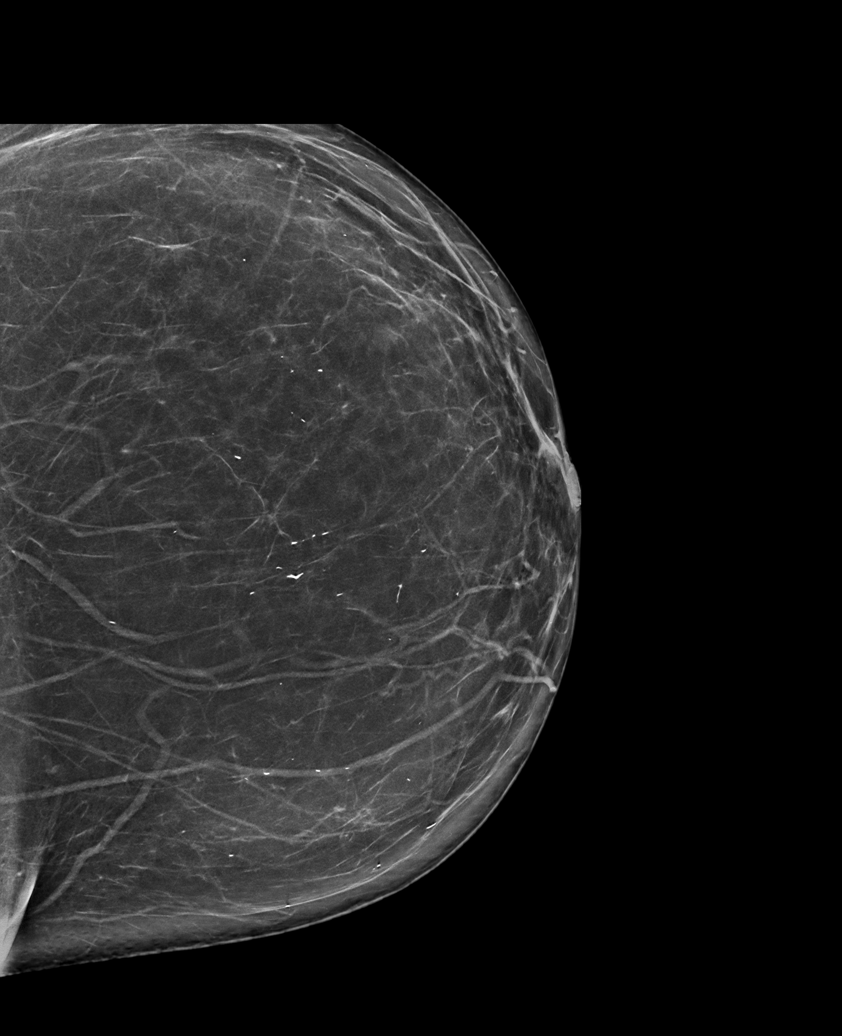

[R MLO synth-2D (1 of 2)]
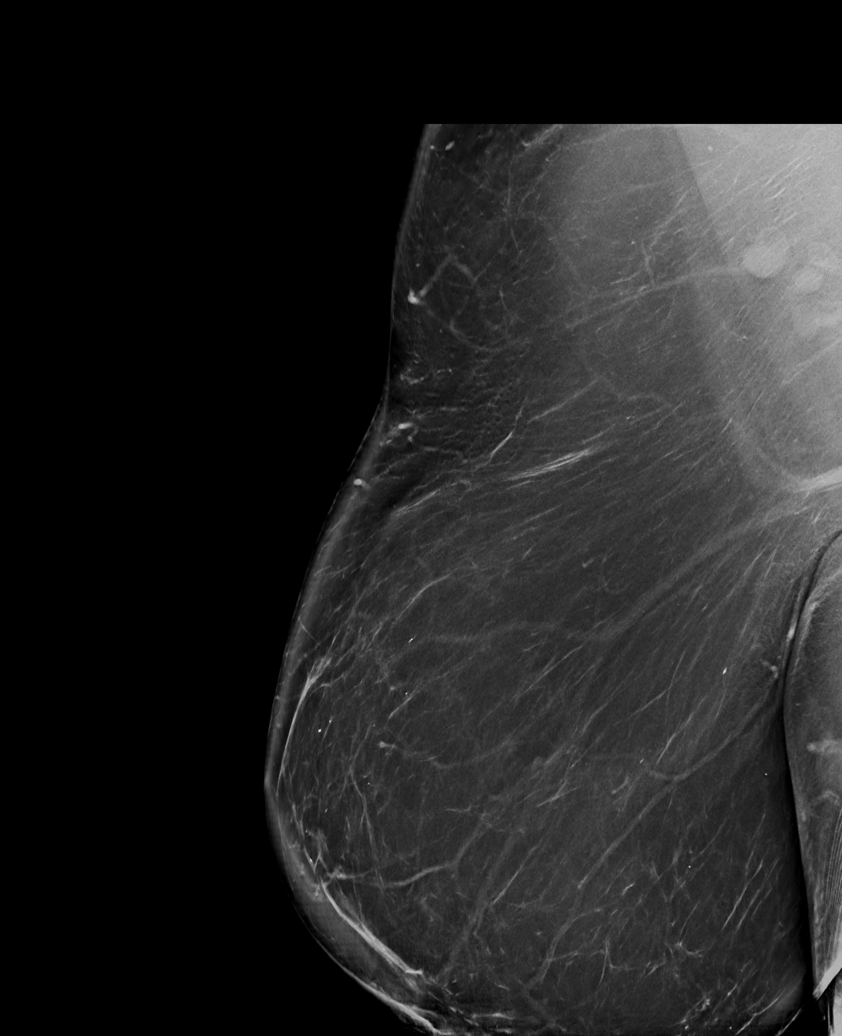

[L MLO synth-2D]
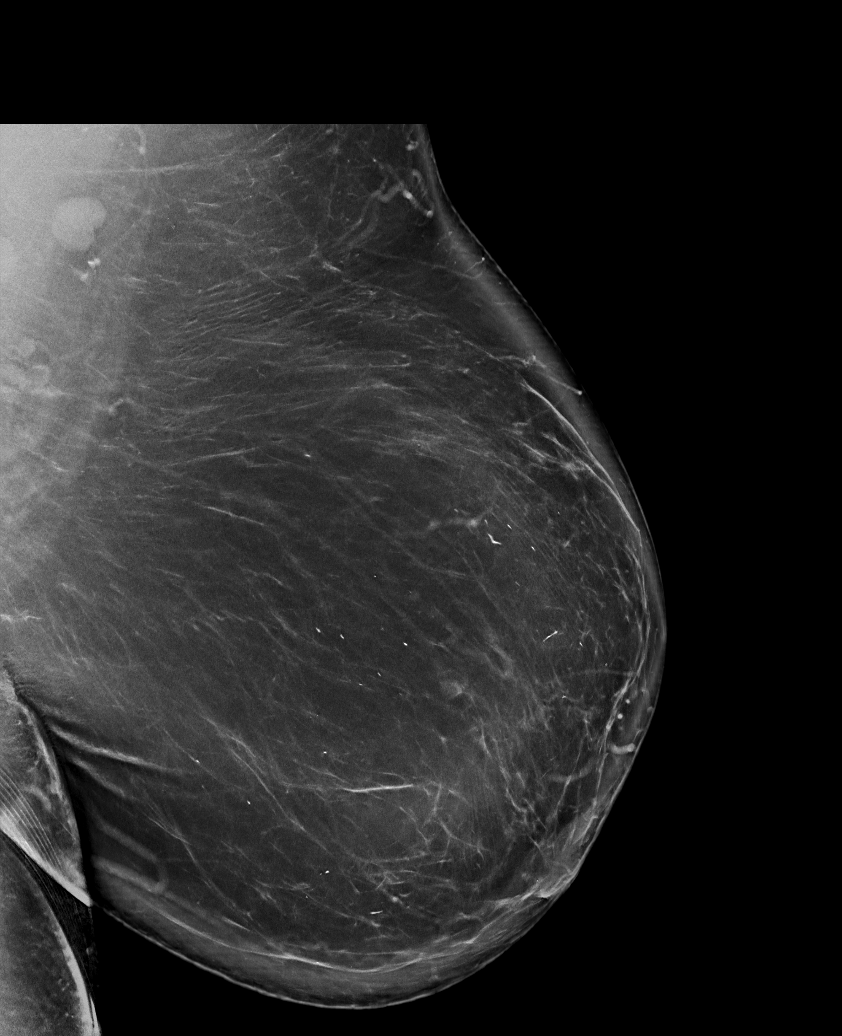

[R CC synth-2D]
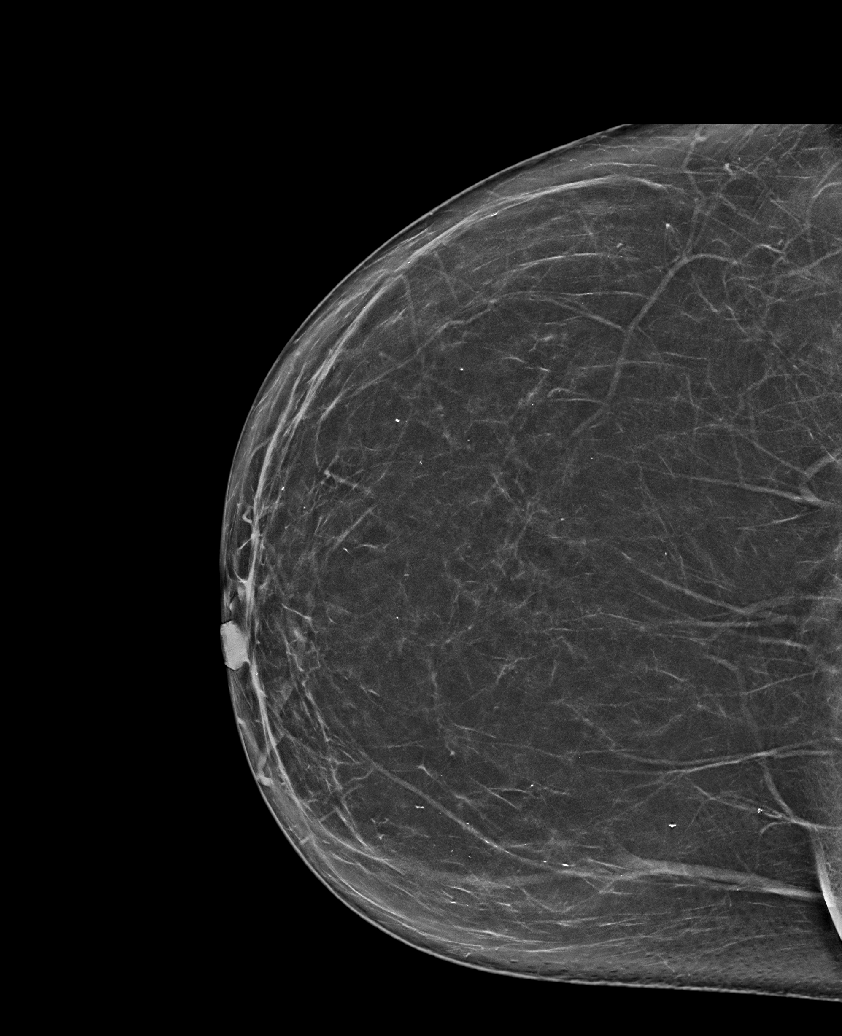

[L CC synth-2D (2 of 2)]
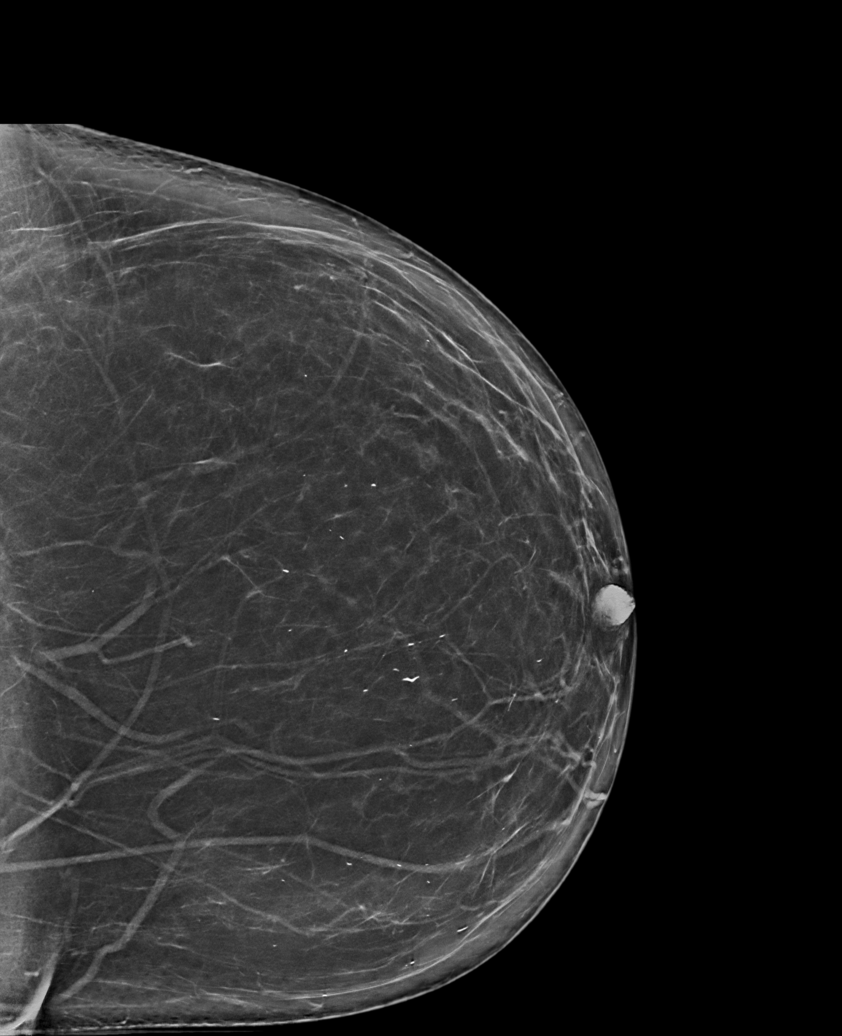

[R MLO synth-2D (2 of 2)]
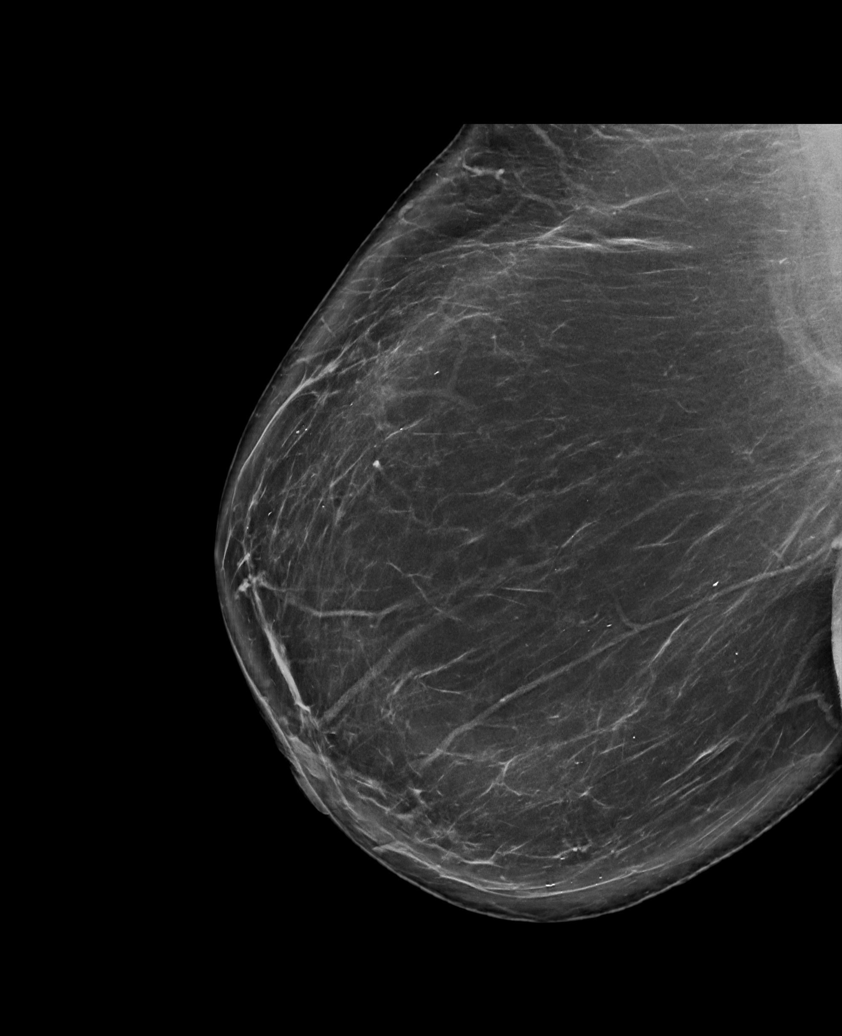

[6 of 36 positions shown; findings below may reference images not displayed]

FINDINGS: There are no findings suspicious for malignancy.
IMPRESSION: No mammographic evidence of malignancy. A result letter of this
screening mammogram will be mailed directly to the patient.

RECOMMENDATION:
Screening mammogram in one year. (Code:0E-3-N98)

BI-RADS CATEGORY  1: Negative.

## 2023-10-28 ENCOUNTER — Other Ambulatory Visit (HOSPITAL_COMMUNITY): Payer: Medicare Other

## 2023-10-28 ENCOUNTER — Other Ambulatory Visit: Payer: Medicare Other

## 2023-11-15 ENCOUNTER — Other Ambulatory Visit: Payer: Self-pay | Admitting: Obstetrics and Gynecology

## 2023-11-15 DIAGNOSIS — Z1231 Encounter for screening mammogram for malignant neoplasm of breast: Secondary | ICD-10-CM

## 2023-11-26 ENCOUNTER — Ambulatory Visit
Admission: RE | Admit: 2023-11-26 | Discharge: 2023-11-26 | Disposition: A | Discharge status: Home / Self Care | Source: Ambulatory Visit | Attending: Obstetrics and Gynecology | Admitting: Obstetrics and Gynecology

## 2023-11-26 DIAGNOSIS — Z1231 Encounter for screening mammogram for malignant neoplasm of breast: Secondary | ICD-10-CM | POA: Diagnosis not present

## 2023-12-01 ENCOUNTER — Ambulatory Visit: Payer: Self-pay | Admitting: Obstetrics and Gynecology

## 2024-01-27 DIAGNOSIS — J329 Chronic sinusitis, unspecified: Secondary | ICD-10-CM | POA: Diagnosis not present

## 2024-01-27 DIAGNOSIS — B9689 Other specified bacterial agents as the cause of diseases classified elsewhere: Secondary | ICD-10-CM | POA: Diagnosis not present

## 2024-01-27 DIAGNOSIS — Z03818 Encounter for observation for suspected exposure to other biological agents ruled out: Secondary | ICD-10-CM | POA: Diagnosis not present

## 2024-01-27 DIAGNOSIS — R051 Acute cough: Secondary | ICD-10-CM | POA: Diagnosis not present

## 2024-02-11 DIAGNOSIS — J029 Acute pharyngitis, unspecified: Secondary | ICD-10-CM | POA: Diagnosis not present

## 2024-04-20 ENCOUNTER — Ambulatory Visit (HOSPITAL_COMMUNITY)
Admission: RE | Admit: 2024-04-20 | Discharge: 2024-04-20 | Disposition: A | Discharge status: Home / Self Care | Payer: Medicare Other | Source: Ambulatory Visit | Attending: Internal Medicine | Admitting: Internal Medicine

## 2024-04-20 DIAGNOSIS — I7121 Aneurysm of the ascending aorta, without rupture: Secondary | ICD-10-CM | POA: Diagnosis present

## 2024-04-20 LAB — ECHOCARDIOGRAM COMPLETE
Area-P 1/2: 3.81 cm2
S' Lateral: 1.5 cm

## 2024-04-21 ENCOUNTER — Ambulatory Visit: Payer: Self-pay | Admitting: Cardiology

## 2024-04-21 NOTE — Progress Notes (Signed)
 Normal heart function.  Mild dilation of ascending aorta, slightly increased in dimension in since 2024. Recommend repeat echocardiogram in 1 year for ascending aorta dilatation.  Thanks MJP

## 2024-04-27 NOTE — Progress Notes (Signed)
 Left message to call back using [employer].

## 2024-04-28 NOTE — Telephone Encounter (Signed)
 PT returning call to nurse for results
